# Patient Record
Sex: Male | Born: 2000
Health system: Southern US, Community
[De-identification: ages and names within clinical notes are randomized; demographics above are authoritative.]

## PROBLEM LIST (undated history)

## (undated) HISTORY — PX: DENTAL SURGERY: SHX609

---
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
## 2022-09-14 ENCOUNTER — Ambulatory Visit
Admission: EM | Admit: 2022-09-14 | Discharge: 2022-09-14 | Disposition: A | Discharge status: Home / Self Care | Payer: 59 | Attending: Urgent Care | Admitting: Urgent Care

## 2022-09-14 ENCOUNTER — Encounter: Payer: Self-pay | Admitting: Emergency Medicine

## 2022-09-14 DIAGNOSIS — Z1152 Encounter for screening for COVID-19: Secondary | ICD-10-CM | POA: Diagnosis not present

## 2022-09-14 DIAGNOSIS — Z720 Tobacco use: Secondary | ICD-10-CM | POA: Insufficient documentation

## 2022-09-14 DIAGNOSIS — J029 Acute pharyngitis, unspecified: Secondary | ICD-10-CM | POA: Insufficient documentation

## 2022-09-14 DIAGNOSIS — J069 Acute upper respiratory infection, unspecified: Secondary | ICD-10-CM | POA: Insufficient documentation

## 2022-09-14 LAB — POCT RAPID STREP A (OFFICE): Rapid Strep A Screen: NEGATIVE

## 2022-09-14 MED ORDER — BENZONATATE 100 MG PO CAPS: 100.0000 mg | ORAL_CAPSULE | Freq: Three times a day (TID) | ORAL | 0 refills | Status: DC | PRN

## 2022-09-14 MED ORDER — CETIRIZINE HCL 10 MG PO TABS: 10.0000 mg | ORAL_TABLET | Freq: Every day | ORAL | 0 refills | Status: AC

## 2022-09-14 MED ORDER — PROMETHAZINE-DM 6.25-15 MG/5ML PO SYRP: 2.5000 mL | ORAL_SOLUTION | Freq: Three times a day (TID) | ORAL | 0 refills | Status: DC | PRN

## 2022-09-14 MED ORDER — PREDNISONE 20 MG PO TABS: 40.0000 mg | ORAL_TABLET | Freq: Every day | ORAL | 0 refills | Status: DC

## 2022-09-14 NOTE — Discharge Instructions (Addendum)
We will notify you of your test results as they arrive and may take between about 24 hours.  Your strep culture will take 3-4 days however. I encourage you to sign up for MyChart if you have not already done so as this can be the easiest way for [place] to communicate results to you online or through a phone app.  Generally, we only contact you if it is a positive test result.  In the meantime, if you develop worsening symptoms including fever, chest pain, shortness of breath despite our current treatment plan then please report to the emergency room as this may be a sign of worsening status from possible viral infection.  Otherwise, we will manage this as a viral syndrome. For sore throat or cough try using a honey-based tea. Use 3 teaspoons of honey with juice squeezed from half lemon. Place shaved pieces of ginger into 1/2-1 cup of water and warm over stove top. Then mix the ingredients and repeat every 4 hours as needed. Please take Tylenol 500mg -650mg  every 6 hours for aches and pains, fevers. Hydrate very well with at least 2 liters of water. Eat light meals such as soups to replenish electrolytes and soft fruits, veggies. Start an antihistamine like Zyrtec for postnasal drainage, sinus congestion.  You can take this together with prednisone.  Use the cough medications as needed.

## 2022-09-14 NOTE — ED Triage Notes (Signed)
Sore throat and fever since last night.

## 2022-09-14 NOTE — ED Provider Notes (Signed)
[facility]  Note:  This document was prepared using Dragon voice recognition software and may include unintentional dictation errors.  MRN: [id number] DOB: 2001/06/20  Subjective:   Ronald Alvarez is a 22 y.o. male presenting for 1 day history of acute onset of recurrent fever, throat pain, coughing that elicits chest pain and chest tightness. Vapes multiple times daily.   No current facility-administered medications for this encounter.  Current Outpatient Medications:    lidocaine (XYLOCAINE) 2 % solution, Use as directed 15 mLs in the mouth or throat as needed for mouth pain., Disp: 100 mL, Rfl: 0   No Active Allergies   History reviewed. No pertinent past medical history.   Past Surgical History:  Procedure Laterality Date   DENTAL SURGERY      Family History  Problem Relation Age of Onset   Hypertension Father    Diabetes Father     Social History   Tobacco Use   Smoking status: Passive Smoke Exposure - Never Smoker   Smokeless tobacco: Never  Vaping Use   Vaping Use: Every day   Substances: Nicotine  Substance Use Topics   Alcohol use: No   Drug use: No    ROS   Objective:   Vitals: BP 125/84 (BP Location: Right Arm)   Pulse 82   Temp 99.8 F (37.7 C) (Oral)   Resp 17   SpO2 97%   Physical Exam Constitutional:      General: He is not in acute distress.    Appearance: Normal appearance. He is well-developed and normal weight. He is not ill-appearing, toxic-appearing or diaphoretic.  HENT:     Head: Normocephalic and atraumatic.     Right Ear: External ear normal.     Left Ear: External ear normal.     Nose: Congestion present. No rhinorrhea.     Mouth/Throat:     Mouth: Mucous membranes are moist.     Pharynx: Posterior oropharyngeal erythema present. No pharyngeal swelling, oropharyngeal exudate or uvula swelling.     Tonsils: No tonsillar exudate or tonsillar abscesses. 0 on the right. 0 on the left.  Eyes:     General: No  scleral icterus.       Right eye: No discharge.        Left eye: No discharge.     Extraocular Movements: Extraocular movements intact.  Cardiovascular:     Rate and Rhythm: Normal rate and regular rhythm.     Heart sounds: Normal heart sounds. No murmur heard.    No friction rub. No gallop.  Pulmonary:     Effort: Pulmonary effort is normal. No respiratory distress.     Breath sounds: Normal breath sounds. No stridor. No wheezing, rhonchi or rales.  Musculoskeletal:     Cervical back: Normal range of motion.  Neurological:     Mental Status: He is alert and oriented to person, place, and time.  Psychiatric:        Mood and Affect: Mood normal.        Behavior: Behavior normal.        Thought Content: Thought content normal.        Judgment: Judgment normal.     Results for orders placed or performed during the hospital encounter of 09/14/22 (from the past 24 hour(s))  POCT rapid strep A     Status: None   Collection Time: 09/14/22  8:22 AM  Result Value Ref Range   Rapid Strep A Screen Negative Negative  Assessment and Plan :   PDMP not reviewed this encounter.  1. Viral upper respiratory infection   2. Sore throat   3. Vapes nicotine containing substance     Deferred imaging given clear cardiopulmonary exam, hemodynamically stable vital signs. Will manage for viral illness such as viral URI, viral syndrome, viral rhinitis, COVID-19, viral pharyngitis.  Given his respiratory symptoms and vaping, recommended oral prednisone course.  Recommended supportive care. Offered scripts for symptomatic relief. COVID 19 and strep culture are pending. Counseled patient on potential for adverse effects with medications prescribed/recommended today, ER and return-to-clinic precautions discussed, patient verbalized understanding.     [name], [place] 09/14/22 [phone]

## 2022-09-15 LAB — SARS CORONAVIRUS 2 (TAT 6-24 HRS): SARS Coronavirus 2: NEGATIVE

## 2022-09-17 LAB — CULTURE, GROUP A STREP (THRC)

## 2022-10-10 ENCOUNTER — Emergency Department (HOSPITAL_COMMUNITY)
Admission: EM | Admit: 2022-10-10 | Discharge: 2022-10-10 | Disposition: A | Discharge status: Home / Self Care | Payer: 59 | Attending: Emergency Medicine | Admitting: Emergency Medicine

## 2022-10-10 ENCOUNTER — Encounter (HOSPITAL_COMMUNITY): Payer: Self-pay

## 2022-10-10 ENCOUNTER — Other Ambulatory Visit: Payer: Self-pay

## 2022-10-10 DIAGNOSIS — Z20822 Contact with and (suspected) exposure to covid-19: Secondary | ICD-10-CM | POA: Insufficient documentation

## 2022-10-10 DIAGNOSIS — R509 Fever, unspecified: Secondary | ICD-10-CM | POA: Diagnosis present

## 2022-10-10 DIAGNOSIS — J069 Acute upper respiratory infection, unspecified: Secondary | ICD-10-CM | POA: Diagnosis not present

## 2022-10-10 LAB — RESP PANEL BY RT-PCR (RSV, FLU A&B, COVID)  RVPGX2
Influenza A by PCR: NEGATIVE
Influenza B by PCR: NEGATIVE
Resp Syncytial Virus by PCR: NEGATIVE
SARS Coronavirus 2 by RT PCR: NEGATIVE

## 2022-10-10 LAB — GROUP A STREP BY PCR: Group A Strep by PCR: NOT DETECTED

## 2022-10-10 MED ORDER — ACETAMINOPHEN 325 MG PO TABS
650.0000 mg | ORAL_TABLET | Freq: Once | ORAL | Status: AC | PRN
  Administered 2022-10-10: 650 mg via ORAL
  Filled 2022-10-10: qty 2

## 2022-10-10 NOTE — ED Triage Notes (Signed)
Pt arrived from home via POV w c/o fever and sore throat x 3 days. 101.8 in triage ibu today at 2130

## 2022-10-11 NOTE — ED Provider Notes (Signed)
[name]   CSN: [id number] Arrival date & time: 10/10/22  2202     History  Chief Complaint  Patient presents with   Fever   Sore Throat    Ronald Alvarez is a 22 y.o. male.   Fever Max temp prior to arrival:  104 Temp source:  Oral Severity:  Mild Duration:  3 days Timing:  Intermittent Relieved by:  Acetaminophen Worsened by:  Nothing Associated symptoms: chills, cough, headaches, myalgias, nausea and sore throat   Sore Throat Associated symptoms include headaches.       Home Medications Prior to Admission medications   Medication Sig Start Date End Date Taking? Authorizing Provider  benzonatate (TESSALON) 100 MG capsule Take 1 capsule (100 mg total) by mouth 3 (three) times daily as needed for cough. 09/14/22   [name], PA-C  cetirizine (ZYRTEC ALLERGY) 10 MG tablet Take 1 tablet (10 mg total) by mouth daily. 09/14/22   [name], PA-C  lidocaine (XYLOCAINE) 2 % solution Use as directed 15 mLs in the mouth or throat as needed for mouth pain. 10/11/20   [name], [name], FNP  predniSONE (DELTASONE) 20 MG tablet Take 2 tablets (40 mg total) by mouth daily with breakfast. 09/14/22   [name], PA-C  promethazine-dextromethorphan (PROMETHAZINE-DM) 6.25-15 MG/5ML syrup Take 2.5 mLs by mouth 3 (three) times daily as needed for cough. 09/14/22   [name], PA-C      Allergies    Patient has no active allergies.    Review of Systems   Review of Systems  Constitutional:  Positive for chills and fever.  HENT:  Positive for sore throat.   Respiratory:  Positive for cough.   Gastrointestinal:  Positive for nausea.  Musculoskeletal:  Positive for myalgias.  Neurological:  Positive for headaches.    Physical Exam Updated Vital Signs BP 119/79   Pulse 94   Temp 100.1 F (37.8 C) (Oral)   Resp 14   Ht 5\' 5"  (1.651 m)   Wt 61.7 kg   SpO2 99%   BMI 22.63 kg/m  Physical Exam Vitals and nursing note  reviewed.  Constitutional:      Appearance: He is well-developed.  HENT:     Head: Normocephalic and atraumatic.  Cardiovascular:     Rate and Rhythm: Normal rate.  Pulmonary:     Effort: Pulmonary effort is normal. No respiratory distress.  Abdominal:     General: There is no distension.     Palpations: Abdomen is soft. There is no mass.  Musculoskeletal:        General: Normal range of motion.     Cervical back: Normal range of motion.  Skin:    General: Skin is warm and dry.  Neurological:     General: No focal deficit present.     Mental Status: He is alert.     ED Results / Procedures / Treatments   Labs (all labs ordered are listed, but only abnormal results are displayed) Labs Reviewed  GROUP A STREP BY PCR  RESP PANEL BY RT-PCR (RSV, FLU A&B, COVID)  [id number]    EKG None  Radiology No results found.  Procedures Procedures    Medications Ordered in ED Medications  acetaminophen (TYLENOL) tablet 650 mg (650 mg Oral Given 10/10/22 2231)    ED Course/ Medical Decision Making/ A&P  Medical Decision Making Risk OTC drugs.   Viral illness of unclear etiology. Covid/flu negative. No distress. Starting to feel better. No indication for further workup at this time. Stable for d/c.   Final Clinical Impression(s) / ED Diagnoses Final diagnoses:  Upper respiratory tract infection, unspecified type    Rx / DC Orders ED Discharge Orders     None         Ronald Alvarez, Ronald Cornea, MD 10/11/22 [phone]

## 2022-10-12 ENCOUNTER — Encounter: Payer: Self-pay | Admitting: Emergency Medicine

## 2022-10-12 ENCOUNTER — Ambulatory Visit
Admission: EM | Admit: 2022-10-12 | Discharge: 2022-10-12 | Disposition: A | Discharge status: Home / Self Care | Payer: 59 | Attending: Nurse Practitioner | Admitting: Nurse Practitioner

## 2022-10-12 DIAGNOSIS — J039 Acute tonsillitis, unspecified: Secondary | ICD-10-CM | POA: Insufficient documentation

## 2022-10-12 DIAGNOSIS — J029 Acute pharyngitis, unspecified: Secondary | ICD-10-CM | POA: Diagnosis not present

## 2022-10-12 DIAGNOSIS — Z1152 Encounter for screening for COVID-19: Secondary | ICD-10-CM | POA: Insufficient documentation

## 2022-10-12 LAB — POCT MONO SCREEN (KUC): Mono, POC: NEGATIVE

## 2022-10-12 LAB — POCT RAPID STREP A (OFFICE): Rapid Strep A Screen: NEGATIVE

## 2022-10-12 NOTE — ED Provider Notes (Signed)
[facility]    CSN: [id number] Arrival date & time: 10/12/22  1021      History   Chief Complaint No chief complaint on file.   HPI Ronald Alvarez is a 22 y.o. male.   Patient presents today for 4-day history of high fevers over the weekend, now fevers are low-grade per his report.  Also endorses sore throat and headache with some dizziness and decreased appetite.  Reports her throat has improved a little bit since it began.  Has also been more tired than normal.  He denies cough, shortness of breath or chest pain, nasal congestion, runny nose, ear pain, abdominal pain, nausea/vomiting, diarrhea.  No known contacts with similar symptoms.  Has been taking ibuprofen and Tylenol for his symptoms which help until it wears off.  He is requesting COVID-19 testing today.  Reports his employer will not accept a work note and will only let him stay home from work if he continues to get tested for COVID-19.  Reports she was seen in the emergency room 2 days ago, had a negative COVID test and negative strep throat test.    History reviewed. No pertinent past medical history.  Patient Active Problem List   Diagnosis Date Noted   Abdominal pain, chronic, epigastric 06/06/2015   Cephalalgia 06/06/2015   Chicken pox 04/17/2014   Sore throat 07/09/2013   Hoarseness of voice 07/09/2013   Laryngitis, acute 07/09/2013    Past Surgical History:  Procedure Laterality Date   DENTAL SURGERY         Home Medications    Prior to Admission medications   Medication Sig Start Date End Date Taking? Authorizing Provider  cetirizine (ZYRTEC ALLERGY) 10 MG tablet Take 1 tablet (10 mg total) by mouth daily. 09/14/22   [name], PA-C    Family History Family History  Problem Relation Age of Onset   Hypertension Father    Diabetes Father     Social History Social History   Tobacco Use   Smoking status: Passive Smoke Exposure - Never Smoker   Smokeless tobacco: Never  Vaping Use    Vaping Use: Every day   Substances: Nicotine  Substance Use Topics   Alcohol use: No   Drug use: No     Allergies   Patient has no known allergies.   Review of Systems Review of Systems Per HPI  Physical Exam Triage Vital Signs ED Triage Vitals  Enc Vitals Group     BP 10/12/22 1058 112/72     Pulse Rate 10/12/22 1058 88     Resp 10/12/22 1058 18     Temp 10/12/22 1058 99.1 F (37.3 C)     Temp Source 10/12/22 1058 Oral     SpO2 10/12/22 1058 96 %     Weight --      Height --      Head Circumference --      Peak Flow --      Pain Score 10/12/22 1101 3     Pain Loc --      Pain Edu? --      Excl. in [place]? --    No data found.  Updated Vital Signs BP 112/72 (BP Location: Right Arm)   Pulse 88   Temp 99.1 F (37.3 C) (Oral)   Resp 18   SpO2 96%   Visual Acuity Right Eye Distance:   Left Eye Distance:   Bilateral Distance:    Right Eye Near:   Left  Eye Near:    Bilateral Near:     Physical Exam Vitals and nursing note reviewed.  Constitutional:      General: He is not in acute distress.    Appearance: He is well-developed. He is not ill-appearing, toxic-appearing or diaphoretic.  HENT:     Head: Normocephalic and atraumatic.     Right Ear: Tympanic membrane, ear canal and external ear normal. No drainage, swelling or tenderness. No middle ear effusion. Tympanic membrane is not erythematous.     Left Ear: Tympanic membrane, ear canal and external ear normal. No drainage, swelling or tenderness.  No middle ear effusion. Tympanic membrane is not erythematous.     Nose: No congestion or rhinorrhea.     Mouth/Throat:     Mouth: Mucous membranes are dry.     Pharynx: Uvula midline. Posterior oropharyngeal erythema and uvula swelling present.     Tonsils: Tonsillar exudate present. No tonsillar abscesses. 2+ on the right. 2+ on the left.  Eyes:     Conjunctiva/sclera: Conjunctivae normal.  Cardiovascular:     Rate and Rhythm: Normal rate and regular  rhythm.  Pulmonary:     Effort: Pulmonary effort is normal. No respiratory distress.     Breath sounds: Normal breath sounds. No wheezing, rhonchi or rales.  Abdominal:     General: Abdomen is flat. Bowel sounds are normal. There is no distension.     Tenderness: There is no abdominal tenderness. There is no guarding.  Musculoskeletal:     Cervical back: Neck supple.  Lymphadenopathy:     Cervical: No cervical adenopathy.  Skin:    General: Skin is warm and dry.     Capillary Refill: Capillary refill takes less than 2 seconds.     Coloration: Skin is not pale.     Findings: No erythema or rash.  Neurological:     Mental Status: He is alert and oriented to person, place, and time.  Psychiatric:        Behavior: Behavior is cooperative.      UC Treatments / Results  Labs (all labs ordered are listed, but only abnormal results are displayed) Labs Reviewed  SARS CORONAVIRUS 2 (TAT 6-24 HRS)  CULTURE, GROUP A STREP [facility])  POCT RAPID STREP A (OFFICE)  POCT MONO SCREEN ([facility])    EKG   Radiology No results found.  Procedures Procedures (including critical care time)  Medications Ordered in UC Medications - No data to display  Initial Impression / Assessment and Plan / UC Course  I have reviewed the triage vital signs and the nursing notes.  Pertinent labs & imaging results that were available during my care of the patient were reviewed by me and considered in my medical decision making (see chart for details).   Patient is well-appearing, normotensive, afebrile, not tachycardic, not tachypneic, oxygenating well on room air.    1. Acute tonsillitis, unspecified etiology Rapid strep negative, Monospot negative Throat culture pending Centor score 3 Continue supportive care while throat culture is pending Note given for work ER and return precautions discussed with patient  2. Encounter for screening for COVID-19 COVID-19 testing obtained today for rule out  The  patient was given the opportunity to ask questions.  All questions answered to their satisfaction.  The patient is in agreement to this plan.    Final Clinical Impressions(s) / UC Diagnoses   Final diagnoses:  Sore throat  Acute tonsillitis, unspecified etiology  Encounter for screening for COVID-19  Discharge Instructions      The rapid strep throat test is negative as well as a monotest today.  We have sent the strep throat test for a culture and will Horgan you in 2 days if this is positive showing we need to treat with antibiotic therapy.  We have also tested you for COVID-19 today.   Some things that can make you feel better in the meantime are: - Increased rest - Increasing fluid with water/sugar free electrolytes - Acetaminophen and ibuprofen as needed for fever/pain - Salt water gargling, chloraseptic spray and throat lozenges for sore throat     ED Prescriptions   None    PDMP not reviewed this encounter.   Eulogio Bear, NP 10/12/22 [phone]

## 2022-10-12 NOTE — ED Triage Notes (Signed)
Needs covid test for work.  Fever over the weekend with sore throat.  Had strep test and covid test done 2 days ago int he ED that was negative.  States pain in throat is getting better.

## 2022-10-12 NOTE — Discharge Instructions (Addendum)
The rapid strep throat test is negative as well as a monotest today.  We have sent the strep throat test for a culture and will Dauzat you in 2 days if this is positive showing we need to treat with antibiotic therapy.  We have also tested you for COVID-19 today.   Some things that can make you feel better in the meantime are: - Increased rest - Increasing fluid with water/sugar free electrolytes - Acetaminophen and ibuprofen as needed for fever/pain - Salt water gargling, chloraseptic spray and throat lozenges for sore throat

## 2022-10-13 LAB — SARS CORONAVIRUS 2 (TAT 6-24 HRS): SARS Coronavirus 2: NEGATIVE

## 2022-10-15 LAB — CULTURE, GROUP A STREP (THRC)

## 2024-04-02 ENCOUNTER — Ambulatory Visit: Admitting: Urology

## 2024-04-02 VITALS — BP 143/79 | HR 66 | Ht 65.0 in | Wt 150.0 lb

## 2024-04-02 DIAGNOSIS — R102 Pelvic and perineal pain: Secondary | ICD-10-CM | POA: Diagnosis not present

## 2024-04-02 DIAGNOSIS — N5082 Scrotal pain: Secondary | ICD-10-CM | POA: Diagnosis not present

## 2024-04-02 LAB — URINALYSIS, ROUTINE W REFLEX MICROSCOPIC
Bilirubin, UA: NEGATIVE
Glucose, UA: NEGATIVE
Ketones, UA: NEGATIVE
Leukocytes,UA: NEGATIVE
Nitrite, UA: NEGATIVE
Protein,UA: NEGATIVE
RBC, UA: NEGATIVE
Specific Gravity, UA: 1.015 (ref 1.005–1.030)
Urobilinogen, Ur: 0.2 mg/dL (ref 0.2–1.0)
pH, UA: 6.5 (ref 5.0–7.5)

## 2024-04-02 MED ORDER — ERYTHROMYCIN BASE 500 MG PO TABS: 500.0000 mg | ORAL_TABLET | Freq: Four times a day (QID) | ORAL | 0 refills | Status: AC

## 2024-04-02 MED ORDER — METRONIDAZOLE 500 MG PO TABS: 2000.0000 mg | ORAL_TABLET | Freq: Once | ORAL | 0 refills | Status: AC

## 2024-04-02 NOTE — Progress Notes (Signed)
 04/02/2024 9:13 AM   Ronald Alvarez [birth year] [id number]  Referring provider: Marvine Rush, MD [address] [place],  [place] [address]  No chief complaint on file.   HPI:  New pt -   1) pelvic pain - pain at tip of penis and inside. Testicle pain - comes and goes. Both sides. Took abx and then had STI screen. No change. Sometimes SP area. Right now - no pain. No change with voiding. Some strain to void. No scrotal or inguinal surgery. No h/o STI. No NG risk. On his feet 12 hrs a day. No constipation. Also noting pulsating stomach pains too.  06/25 STI screen negative  UA normal   PMH: No past medical history on file.  Surgical History: Past Surgical History:  Procedure Laterality Date   DENTAL SURGERY      Home Medications:  Allergies as of 04/02/2024   No Known Allergies      Medication List        Accurate as of April 02, 2024  9:13 AM. If you have any questions, ask your nurse or doctor.          cetirizine  10 MG tablet Commonly known as: ZyrTEC  Allergy Take 1 tablet (10 mg total) by mouth daily.        Allergies: No Known Allergies  Family History: Family History  Problem Relation Age of Onset   Hypertension Father    Diabetes Father     Social History:  reports that he is a non-smoker but has been exposed to tobacco smoke. He has never used smokeless tobacco. He reports that he does not drink alcohol and does not use drugs.   Physical Exam: BP (!) 143/79   Pulse 66   Ht 5' 5 (1.651 m)   Wt 150 lb (68 kg)   BMI 24.96 kg/m   Constitutional:  Alert and oriented, No acute distress. HEENT: [place] AT, moist mucus membranes.  Trachea midline, no masses. Cardiovascular: No clubbing, cyanosis, or edema. Respiratory: Normal respiratory effort, no increased work of breathing. GI: Abdomen is soft, nontender, nondistended, no abdominal masses GU: No CVA tenderness Lymph: No cervical or inguinal lymphadenopathy. Skin: No rashes, bruises or  suspicious lesions. Neurologic: Grossly intact, no focal deficits, moving all 4 extremities. Psychiatric: Normal mood and affect. GU: Penis circumcised, normal foreskin, testicles descended bilaterally and palpably normal, bilateral epididymis palpably normal, scrotum normal - no pain on palpation of urethra or perineum.     Laboratory Data: Lab Results  Component Value Date   WBC 5.8 09/22/2015   HGB 14.5 09/22/2015   HCT 41.0 09/22/2015   MCV 82.8 09/22/2015   PLT 252 09/22/2015    Lab Results  Component Value Date   CREATININE 0.56 09/22/2015    No results found for: PSA  No results found for: TESTOSTERONE  No results found for: HGBA1C  Urinalysis    Component Value Date/Time   COLORURINE YELLOW 09/22/2015 1044   APPEARANCEUR CLEAR 09/22/2015 1044   LABSPEC >1.030 (H) 09/22/2015 1044   PHURINE 5.5 09/22/2015 1044   GLUCOSEU NEGATIVE 09/22/2015 1044   HGBUR NEGATIVE 09/22/2015 1044   BILIRUBINUR NEGATIVE 09/22/2015 1044   KETONESUR NEGATIVE 09/22/2015 1044   PROTEINUR NEGATIVE 09/22/2015 1044   UROBILINOGEN 0.2 03/05/2012 0100   NITRITE NEGATIVE 09/22/2015 1044   LEUKOCYTESUR NEGATIVE 09/22/2015 1044    No results found for: LABMICR, WBCUA, RBCUA, LABEPIT, MUCUS, BACTERIA  Pertinent Imaging: N/a    Assessment & Plan:    1.  Scrotal pain (Primary) - quick course for atypicals. Also discussed BT, stress management, also trial of Tadalafil, NSAID or alpha blocker. Also, discussed cysto and imaging if persists. He will proceed with course abx and BT. Notify if symptoms persist or worsen.   - Urinalysis, Routine w reflex microscopic   No follow-ups on file.  Donnice Brooks, MD  [facility]  [address] [place], [place] [address] [phone]
